# Patient Record
Sex: Male | Born: 2000
Health system: Southern US, Community
[De-identification: ages and names within clinical notes are randomized; demographics above are authoritative.]

---
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
## 2018-04-03 ENCOUNTER — Telehealth: Payer: Self-pay | Admitting: Family Medicine

## 2018-04-03 NOTE — Telephone Encounter (Signed)
Appointment has been made for 04/24/18 at 12:15pm.

## 2018-04-03 NOTE — Telephone Encounter (Signed)
Left vm for Craig Osborn to call back. Need to r/s new pt appt.

## 2018-04-03 NOTE — Telephone Encounter (Signed)
Dr. Dayton MartesAron please advise[place]    Copied from CRM [phone]. Topic: Appointment Scheduling - Scheduling Inquiry for Clinic >> Mar 30, 2018 12:14 PM [name], [name] wrote: Reason for CRM: pt has aged out of peds, and going to college this next year.  Was hoping to get a new pt appt prior to June 30 (when he leaves for summer school) but will return for one week on 8/7. Is it ok to work in? Mom is a pt of Dr Dayton MartesAron

## 2018-04-03 NOTE — Telephone Encounter (Signed)
Yes okay to work in.

## 2018-04-24 ENCOUNTER — Ambulatory Visit (INDEPENDENT_AMBULATORY_CARE_PROVIDER_SITE_OTHER): Payer: BLUE CROSS/BLUE SHIELD | Admitting: Family Medicine

## 2018-04-24 ENCOUNTER — Encounter: Payer: Self-pay | Admitting: Family Medicine

## 2018-04-24 VITALS — BP 110/66 | HR 71 | Temp 98.3°F | Ht 74.5 in | Wt 162.2 lb

## 2018-04-24 DIAGNOSIS — Z23 Encounter for immunization: Secondary | ICD-10-CM

## 2018-04-24 DIAGNOSIS — Z020 Encounter for examination for admission to educational institution: Secondary | ICD-10-CM

## 2018-04-24 NOTE — Patient Instructions (Signed)
Great to see you. Congratulations!  Come back to your second dose of your HPV vaccine when you come home.

## 2018-04-24 NOTE — Assessment & Plan Note (Signed)
Discussed dangers of smoking, alcohol, and drug abuse.  Also discussed sexual activity, pregnancy risk, and STD risk.  Encouraged to get regular exercise and a balanced diet.  Discussed immunizations and they have also been updated in the chart.  

## 2018-04-24 NOTE — Progress Notes (Signed)
Subjective:   Patient ID: Craig Blossomhristopher Malone Jr., male    DOB: [birth year], 17 y.o.   MRN: [id number]  Craig BlossomChristopher Rounds Jr. is a pleasant 17 y.o. year old male who presents to clinic today with New Patient (Initial Visit) (Patient is here today to establish care.  Patient had last wellness in November and [facility] and they are faxing that note over.  He would like to receive the Bexsero and HPV#1 today.  He does have a murmur per paperwork)  on 04/24/2018  HPI:  Patient is here today to establish care. Patient had last wellness in November [facility] and they are faxing that note over. He would like to receive the Bexsero and HPV#1 today.  Was born with a heart murmur, but per mom, out grew it by the time he was 1.  He is going to [employer] [place] in the fall.  Very excited.  Plans to major in math- plans to go into sports analytics/statistics.  Has never been sexually active.  Current Outpatient Medications on File Prior to Visit  Medication Sig Dispense Refill  . chlorhexidine (PERIDEX) 0.12 % solution Use as directed 15 mLs in the mouth or throat 2 (two) times daily. 150 mL 0   No current facility-administered medications on file prior to visit.     No Known Allergies  No past medical history on file.  No past surgical history on file.  No family history on file.  Social History   Socioeconomic History  . Marital status: Single    Spouse name: Not on file  . Number of children: Not on file  . Years of education: Not on file  . Highest education level: Not on file  Occupational History  . Not on file  Social Needs  . Financial resource strain: Not on file  . Food insecurity:    Worry: Not on file    Inability: Not on file  . Transportation needs:    Medical: Not on file    Non-medical: Not on file  Tobacco Use  . Smoking status: Never Smoker  . Smokeless tobacco: Never Used  Substance and Sexual Activity  . Alcohol use: Not on file    . Drug use: Not on file  . Sexual activity: Not on file  Lifestyle  . Physical activity:    Days per week: Not on file    Minutes per session: Not on file  . Stress: Not on file  Relationships  . Social connections:    Talks on phone: Not on file    Gets together: Not on file    Attends religious service: Not on file    Active member of club or organization: Not on file    Attends meetings of clubs or organizations: Not on file    Relationship status: Not on file  . Intimate partner violence:    Fear of current or ex partner: Not on file    Emotionally abused: Not on file    Physically abused: Not on file    Forced sexual activity: Not on file  Other Topics Concern  . Not on file  Social History Narrative  . Not on file   The PMH, PSH, Social History, Family History, Medications, and allergies have [facility], and have been updated if relevant.    Review of Systems  Constitutional: Negative.   HENT: Negative.   Eyes: Negative.   Respiratory: Negative.   Cardiovascular:  Negative.   Gastrointestinal: Negative.   Endocrine: Negative.   Genitourinary: Negative.   Musculoskeletal: Negative.   Skin: Negative.   Allergic/Immunologic: Negative.   Neurological: Negative.   Hematological: Negative.   Psychiatric/Behavioral: Negative.   All other systems reviewed and are negative.      Objective:    BP 110/66 (BP Location: Left Arm, Patient Position: Sitting, Cuff Size: Normal)   Pulse 71   Temp 98.3 F (36.8 C) (Oral)   Ht 6' 2.5" (1.892 m)   Wt 162 lb 3.2 oz (73.6 kg)   SpO2 99%   BMI 20.55 kg/m    Physical Exam  Constitutional: He appears well-developed and well-nourished. No distress.  HENT:  Head: Normocephalic and atraumatic.  Eyes: EOM are normal.  Neck: Normal range of motion.  Cardiovascular: Normal rate and regular rhythm.  Pulmonary/Chest: Effort normal and breath sounds normal.  Abdominal: Soft. Bowel sounds are normal.  Neurological:  He is alert.  Skin: Skin is warm and dry. He is not diaphoretic.  Psychiatric: He has a normal mood and affect. His behavior is normal. Judgment and thought content normal.  Nursing note and vitals reviewed.         Assessment & Plan:   Need for HPV vaccination - Plan: HPV 9-valent vaccine,Recombinat  Need for meningococcal vaccination - Plan: Meningococcal B, OMV No follow-ups on file.

## 2018-05-16 ENCOUNTER — Encounter: Payer: Self-pay | Admitting: Family Medicine

## 2018-06-07 ENCOUNTER — Ambulatory Visit (INDEPENDENT_AMBULATORY_CARE_PROVIDER_SITE_OTHER): Payer: BLUE CROSS/BLUE SHIELD | Admitting: Behavioral Health

## 2018-06-07 DIAGNOSIS — Z23 Encounter for immunization: Secondary | ICD-10-CM | POA: Diagnosis not present

## 2018-06-07 NOTE — Progress Notes (Signed)
Patient presents in clinic today for #2 HPV & Meningococcal vaccinations. IM injections were given in both the right & left deltoid. Patient tolerated the injections well. No signs or symptoms of a reaction before leaving the nurse visit. Patient will call the office at a later date to schedule #3 HPV vaccination.

## 2018-07-12 ENCOUNTER — Ambulatory Visit: Payer: Managed Care, Other (non HMO) | Admitting: Family Medicine

## 2020-01-07 ENCOUNTER — Other Ambulatory Visit: Payer: Self-pay

## 2020-01-07 ENCOUNTER — Ambulatory Visit (INDEPENDENT_AMBULATORY_CARE_PROVIDER_SITE_OTHER): Payer: BC Managed Care – PPO | Admitting: Nurse Practitioner

## 2020-01-07 ENCOUNTER — Encounter: Payer: Self-pay | Admitting: Nurse Practitioner

## 2020-01-07 VITALS — BP 106/72 | HR 72 | Temp 97.7°F | Ht 74.5 in | Wt 170.2 lb

## 2020-01-07 DIAGNOSIS — Z136 Encounter for screening for cardiovascular disorders: Secondary | ICD-10-CM | POA: Diagnosis not present

## 2020-01-07 DIAGNOSIS — Z13220 Encounter for screening for lipoid disorders: Secondary | ICD-10-CM

## 2020-01-07 DIAGNOSIS — Z0000 Encounter for general adult medical examination without abnormal findings: Secondary | ICD-10-CM

## 2020-01-07 LAB — CBC
HCT: 42.2 % (ref 39.0–52.0)
Hemoglobin: 14.3 g/dL (ref 13.0–17.0)
MCHC: 33.9 g/dL (ref 30.0–36.0)
MCV: 95.3 fl (ref 78.0–100.0)
Platelets: 308 10*3/uL (ref 150.0–400.0)
RBC: 4.43 Mil/uL (ref 4.22–5.81)
RDW: 12.1 % (ref 11.5–14.6)
WBC: 4.6 10*3/uL (ref 4.5–10.5)

## 2020-01-07 LAB — COMPREHENSIVE METABOLIC PANEL
ALT: 11 U/L (ref 0–53)
AST: 12 U/L (ref 0–37)
Albumin: 4.6 g/dL (ref 3.5–5.2)
Alkaline Phosphatase: 71 U/L (ref 39–117)
BUN: 11 mg/dL (ref 6–23)
CO2: 32 mEq/L (ref 19–32)
Calcium: 10.1 mg/dL (ref 8.4–10.5)
Chloride: 101 mEq/L (ref 96–112)
Creatinine, Ser: 1.23 mg/dL (ref 0.40–1.50)
GFR: 90.51 mL/min (ref >60.00)
Glucose, Bld: 87 mg/dL (ref 70–99)
Potassium: 4.3 mEq/L (ref 3.5–5.1)
Sodium: 138 mEq/L (ref 135–145)
Total Bilirubin: 1 mg/dL (ref 0.2–1.2)
Total Protein: 7.5 g/dL (ref 6.0–8.3)

## 2020-01-07 LAB — LIPID PANEL
Cholesterol: 205 mg/dL — ABNORMAL HIGH (ref 0–200)
HDL: 56.6 mg/dL (ref >39.00)
LDL Cholesterol: 135 mg/dL — ABNORMAL HIGH (ref 0–99)
NonHDL: 148.54
Total CHOL/HDL Ratio: 4
Triglycerides: 66 mg/dL (ref 0.0–149.0)
VLDL: 13.2 mg/dL (ref 0.0–40.0)

## 2020-01-07 LAB — TSH: TSH: 2.82 u[IU]/mL (ref 0.35–5.50)

## 2020-01-07 NOTE — Patient Instructions (Addendum)
Go to lab for blood draw.  Wish you the best with school.  You make use multivitamin (Men) 1tab daily with food (nature made or centrum brand)   Preventive Care 21-21 Years Old, Male Preventive care refers to lifestyle choices and visits with your health care provider that can promote health and wellness. At this stage in your life, you may start seeing a primary care physician instead of a pediatrician. Your health care is now your responsibility. Preventive care for young adults includes:  A yearly physical exam. This is also called an annual wellness visit.  Regular dental and eye exams.  Immunizations.  Screening for certain conditions.  Healthy lifestyle choices, such as diet and exercise. What can I expect for my preventive care visit? Physical exam Your health care provider may check:  Height and weight. These may be used to calculate body mass index (BMI), which is a measurement that tells if you are at a healthy weight.  Heart rate and blood pressure.  Body temperature. Counseling Your health care provider may ask you questions about:  Past medical problems and family medical history.  Alcohol, tobacco, and drug use.  Home and relationship well-being.  Access to firearms.  Emotional well-being.  Diet, exercise, and sleep habits.  Sexual activity and sexual health. What immunizations do I need?  Influenza (flu) vaccine  This is recommended every year. Tetanus, diphtheria, and pertussis (Tdap) vaccine  You may need a Td booster every 10 years. Varicella (chickenpox) vaccine  You may need this vaccine if you have not already been vaccinated. Human papillomavirus (HPV) vaccine  If recommended by your health care provider, you may need three doses over 6 months. Measles, mumps, and rubella (MMR) vaccine  You may need at least one dose of MMR. You may also need a second dose. Meningococcal conjugate (MenACWY) vaccine  One dose is recommended if you  are 21-67 years old and a Market researcher living in a residence hall, or if you have one of several medical conditions. You may also need additional booster doses. Pneumococcal conjugate (PCV13) vaccine  You may need this if you have certain conditions and were not previously vaccinated. Pneumococcal polysaccharide (PPSV23) vaccine  You may need one or two doses if you smoke cigarettes or if you have certain conditions. Hepatitis A vaccine  You may need this if you have certain conditions or if you travel or work in places where you may be exposed to hepatitis A. Hepatitis B vaccine  You may need this if you have certain conditions or if you travel or work in places where you may be exposed to hepatitis B. Haemophilus influenzae type b (Hib) vaccine  You may need this if you have certain risk factors. You may receive vaccines as individual doses or as more than one vaccine together in one shot (combination vaccines). Talk with your health care provider about the risks and benefits of combination vaccines. What tests do I need? Blood tests  Lipid and cholesterol levels. These may be checked every 5 years starting at age 21.  Hepatitis C test.  Hepatitis B test. Screening  Genital exam to check for testicular cancer or hernias.  Sexually transmitted disease (STD) testing, if you are at risk. Other tests  Tuberculosis skin test.  Vision and hearing tests.  Skin exam. Follow these instructions at home: Eating and drinking   Eat a diet that includes fresh fruits and vegetables, whole grains, lean protein, and low-fat dairy products.  Drink enough  fluid to keep your urine pale yellow.  Do not drink alcohol if: ? Your health care provider tells you not to drink. ? You are under the legal drinking age. In the [place] the legal drinking age is 21.  If you drink alcohol: ? Limit how much you have to 0-2 drinks a day. ? Be aware of how much alcohol is in your drink.  In the [place] one drink equals one 12 oz bottle of beer (355 mL), one 5 oz glass of wine (148 mL), or one 1 oz glass of hard liquor (44 mL). Lifestyle  Take daily care of your teeth and gums.  Stay active. Exercise at least 30 minutes 5 or more days of the week.  Do not use any products that contain nicotine or tobacco, such as cigarettes, e-cigarettes, and chewing tobacco. If you need help quitting, ask your health care provider.  Do not use drugs.  If you are sexually active, practice safe sex. Use a condom or other form of protection to prevent STIs (sexually transmitted infections).  Find healthy ways to cope with stress, such as: ? Meditation, yoga, or listening to music. ? Journaling. ? Talking to a trusted person. ? Spending time with friends and family. Safety  Always wear your seat belt while driving or riding in a vehicle.  Do not drive if you have been drinking alcohol.  Do not ride with someone who has been drinking.  Do not drive when you are tired or distracted.  Do not text while driving.  Wear a helmet and other protective equipment during sports activities.  If you have firearms in your house, make sure you follow all gun safety procedures.  Seek help if you have been bullied, physically abused, or sexually abused.  Use the Internet responsibly to avoid dangers such as online bullying and online sex predators. What's next?  Go to your health care provider once a year for a well check visit.  Ask your health care provider how often you should have your eyes and teeth checked.  Stay up to date on all vaccines. This information is not intended to replace advice given to you by your health care provider. Make sure you discuss any questions you have with your health care provider. Document Revised: 10/11/2018 Document Reviewed: 10/11/2018 Elsevier Patient Education  2020 [employer].

## 2020-01-07 NOTE — Progress Notes (Signed)
Subjective:    Patient ID: Craig Osborn., male    DOB: Apr 02, 2001, 19 y.o.   MRN: [id number]  Patient presents today for complete physical  HPI  denies any acute complaints.  Sexual History (orientation,birth control, marital status, STD):not sexually active at this time, denies need for STD screen  Depression/Suicide: Depression screen [facility] 2/9 01/07/2020 11/30/2017  Decreased Interest 0 0  Down, Depressed, Hopeless 0 0  PHQ - 2 Score 0 0   Vision:up to date  Dental:up to date  Immunizations: (TDAP, Hep C screen, Pneumovax, Influenza, zoster)  Health Maintenance  Topic Date Due  . Flu Shot  01/29/2020*  . HIV Screening  01/06/2021*  . Tetanus Vaccine  04/18/2021  *Topic was postponed. The date shown is not the original due date.   Diet:regular.  Weight:  Wt Readings from Last 3 Encounters:  01/07/20 170 lb 3.2 oz (77.2 kg)  04/24/18 162 lb 3.2 oz (73.6 kg) (67 %, Z= 0.44)*  11/30/17 164 lb (74.4 kg) (71 %, Z= 0.56)*   * Growth percentiles are based on CDC (Boys, 2-20 Years) data.   Exercise:none  Fall Risk: Fall Risk  01/07/2020 11/30/2017  Falls in the past year? 0 No  Number falls in past yr: 0 -  Injury with Fall? 0 -   Medications and allergies reviewed with patient and updated if appropriate.  Patient Active Problem List   Diagnosis Date Noted  . Encounter for school examination 04/24/2018  . ANEMIA, OTHER, UNSPECIFIED 12/28/2006    Current Outpatient Medications on File Prior to Visit  Medication Sig Dispense Refill  . chlorhexidine (PERIDEX) 0.12 % solution Use as directed 15 mLs in the mouth or throat 2 (two) times daily. (Patient not taking: Reported on 01/07/2020) 150 mL 0   No current facility-administered medications on file prior to visit.    History reviewed. No pertinent past medical history.  History reviewed. No pertinent surgical history.  Social History   Socioeconomic History  . Marital status: Single    Spouse name: Not  on file  . Number of children: Not on file  . Years of education: Not on file  . Highest education level: Not on file  Occupational History  . Not on file  Tobacco Use  . Smoking status: Never Smoker  . Smokeless tobacco: Never Used  Substance and Sexual Activity  . Alcohol use: Never    Alcohol/week: 0.0 standard drinks  . Drug use: Never  . Sexual activity: Not on file  Other Topics Concern  . Not on file  Social History Narrative  . Not on file   Social Determinants of Health   Financial Resource Strain:   . Difficulty of Paying Living Expenses: Not on file  Food Insecurity:   . Worried About Programme researcher, broadcasting/film/video in the Last Year: Not on file  . Ran Out of Food in the Last Year: Not on file  Transportation Needs:   . Lack of Transportation (Medical): Not on file  . Lack of Transportation (Non-Medical): Not on file  Physical Activity:   . Days of Exercise per Week: Not on file  . Minutes of Exercise per Session: Not on file  Stress:   . Feeling of Stress : Not on file  Social Connections:   . Frequency of Communication with Friends and Family: Not on file  . Frequency of Social Gatherings with Friends and Family: Not on file  . Attends Religious Services: Not on file  . Active  Member of Clubs or Organizations: Not on file  . Attends Archivist Meetings: Not on file  . Marital Status: Not on file    Family History  Problem Relation Age of Onset  . Hypertension Mother   . Gestational diabetes Mother   . Hyperlipidemia Father   . Asthma Brother   . Asthma Maternal Grandmother   . Diabetes Maternal Grandmother   . Hypertension Maternal Grandmother   . Hypertension Paternal Grandmother   . Lung cancer Paternal Grandfather        Smoker        Review of Systems  Constitutional: Negative for fever, malaise/fatigue and weight loss.  HENT: Negative for congestion and sore throat.   Eyes:       Negative for visual changes  Respiratory: Negative for  cough and shortness of breath.   Cardiovascular: Negative for chest pain, palpitations and leg swelling.  Gastrointestinal: Negative for blood in stool, constipation, diarrhea and heartburn.  Genitourinary: Negative for dysuria, frequency and urgency.  Musculoskeletal: Negative for falls, joint pain and myalgias.  Skin: Negative for rash.  Neurological: Negative for dizziness, sensory change and headaches.  Endo/Heme/Allergies: Does not bruise/bleed easily.  Psychiatric/Behavioral: Negative for depression, substance abuse and suicidal ideas. The patient is not nervous/anxious and does not have insomnia.     Objective:   Vitals:   01/07/20 0820  BP: 106/72  Pulse: 72  Temp: 97.7 F (36.5 C)  SpO2: 97%    Body mass index is 21.56 kg/m.   Physical Examination:  Physical Exam Vitals reviewed.  Constitutional:      General: He is not in acute distress.    Appearance: He is well-developed.  HENT:     Right Ear: Tympanic membrane, ear canal and external ear normal.     Left Ear: Tympanic membrane, ear canal and external ear normal.  Eyes:     Extraocular Movements: Extraocular movements intact.     Conjunctiva/sclera: Conjunctivae normal.  Cardiovascular:     Rate and Rhythm: Normal rate and regular rhythm.     Heart sounds: Normal heart sounds.  Pulmonary:     Effort: Pulmonary effort is normal. No respiratory distress.     Breath sounds: Normal breath sounds.  Chest:     Chest wall: No tenderness.  Abdominal:     General: Bowel sounds are normal.     Palpations: Abdomen is soft.  Musculoskeletal:        General: Normal range of motion.     Cervical back: Normal range of motion and neck supple.     Right lower leg: No edema.     Left lower leg: No edema.  Lymphadenopathy:     Cervical: No cervical adenopathy.  Skin:    General: Skin is warm and dry.     Findings: No rash.  Neurological:     Mental Status: He is alert and oriented to person, place, and time.      Deep Tendon Reflexes: Reflexes are normal and symmetric.  Psychiatric:        Mood and Affect: Mood normal.        Behavior: Behavior normal.        Thought Content: Thought content normal.     ASSESSMENT and PLAN: This visit occurred during the SARS-CoV-2 public health emergency.  Safety protocols were in place, including screening questions prior to the visit, additional usage of staff PPE, and extensive cleaning of exam room while observing appropriate contact time as indicated  for disinfecting solutions.   Quavis was seen today for establish care.  Diagnoses and all orders for this visit:  Preventative health care -     CBC -     Comprehensive metabolic panel -     TSH -     Lipid panel  Encounter for lipid screening for cardiovascular disease -     Lipid panel    No problem-specific Assessment & Plan notes found for this encounter.      Problem List Items Addressed This Visit    None    Visit Diagnoses    Preventative health care    -  Primary   Relevant Orders   CBC   Comprehensive metabolic panel   TSH   Lipid panel   Encounter for lipid screening for cardiovascular disease       Relevant Orders   Lipid panel       Follow up: Return in about 1 year (around 01/06/2021) for CPE (fasting).  Alysia Penna, NP

## 2020-01-13 ENCOUNTER — Ambulatory Visit: Payer: BC Managed Care – PPO | Attending: Internal Medicine

## 2020-01-13 ENCOUNTER — Encounter: Payer: Self-pay | Admitting: Nurse Practitioner

## 2020-01-13 DIAGNOSIS — Z20822 Contact with and (suspected) exposure to covid-19: Secondary | ICD-10-CM

## 2020-01-14 LAB — NOVEL CORONAVIRUS, NAA: SARS-CoV-2, NAA: NOT DETECTED

## 2020-03-12 ENCOUNTER — Ambulatory Visit: Payer: BC Managed Care – PPO | Attending: Internal Medicine

## 2020-03-12 DIAGNOSIS — Z23 Encounter for immunization: Secondary | ICD-10-CM

## 2020-03-12 NOTE — Progress Notes (Signed)
   [id number]  Name:  Craig Osborn.    MRN: [id number] DOB: 11/01/2000  03/12/2020  Mr. [name] was observed post Covid-19 immunization for 15 minutes without incident. He was provided with Vaccine Information Sheet and instruction to access the V-Safe system.   Mr. [name] was instructed to call 911 with any severe reactions post vaccine: Marland Kitchen Difficulty breathing  . Swelling of face and throat  . A fast heartbeat  . A bad rash all over body  . Dizziness and weakness   Immunizations Administered    Name Date Dose VIS Date Route   Pfizer COVID-19 Vaccine 03/12/2020  1:08 PM 0.3 mL 12/25/2018 Intramuscular   Manufacturer: [employer], [employer]   Lot: [id number]   NDC: [address]-2

## 2020-04-06 ENCOUNTER — Ambulatory Visit: Payer: BC Managed Care – PPO

## 2021-03-08 ENCOUNTER — Other Ambulatory Visit: Payer: Self-pay

## 2021-03-08 ENCOUNTER — Ambulatory Visit (INDEPENDENT_AMBULATORY_CARE_PROVIDER_SITE_OTHER): Payer: BC Managed Care – PPO | Admitting: Nurse Practitioner

## 2021-03-08 ENCOUNTER — Encounter: Payer: Self-pay | Admitting: Nurse Practitioner

## 2021-03-08 VITALS — BP 100/70 | HR 70 | Temp 98.8°F | Ht 75.5 in | Wt 174.0 lb

## 2021-03-08 DIAGNOSIS — Z13220 Encounter for screening for lipoid disorders: Secondary | ICD-10-CM

## 2021-03-08 DIAGNOSIS — Z0000 Encounter for general adult medical examination without abnormal findings: Secondary | ICD-10-CM | POA: Diagnosis not present

## 2021-03-08 DIAGNOSIS — Z23 Encounter for immunization: Secondary | ICD-10-CM | POA: Diagnosis not present

## 2021-03-08 DIAGNOSIS — Z136 Encounter for screening for cardiovascular disorders: Secondary | ICD-10-CM | POA: Diagnosis not present

## 2021-03-08 NOTE — Progress Notes (Signed)
Subjective:    Patient ID: Craig Osborn., male    DOB: November 06, 2000, 20 y.o.   MRN: [id number]  Patient presents today for CPE  HPI Denies any acute compliant.  Sexual History (orientation,birth control, marital status, STD): sexually active, denies need for STD screen, no GU symptoms  Depression/Suicide: Depression screen [facility] 2/9 03/08/2021 01/07/2020 11/30/2017  Decreased Interest 0 0 0  Down, Depressed, Hopeless 0 0 0  PHQ - 2 Score 0 0 0   Vision:up to date  Dental:up to date  Immunizations: (TDAP, Hep C screen, Pneumovax, Influenza, zoster)  Health Maintenance  Topic Date Due  . Hepatitis C Screening: USPSTF Recommendation to screen - Ages 18-79 yo.  03/08/2022*  . HIV Screening  03/08/2022*  . Tetanus Vaccine  04/18/2021  . Flu Shot  05/31/2021  . HPV Vaccine  Completed  . COVID-19 Vaccine  Completed  *Topic was postponed. The date shown is not the original due date.   Diet:regular Exercise: daily Weight:  Wt Readings from Last 3 Encounters:  03/08/21 174 lb (78.9 kg)  01/07/20 170 lb 3.2 oz (77.2 kg)  04/24/18 162 lb 3.2 oz (73.6 kg) (67 %, Z= 0.44)*   * Growth percentiles are based on CDC (Boys, 2-20 Years) data.   Fall Risk: Fall Risk  01/07/2020 11/30/2017  Falls in the past year? 0 No  Number falls in past yr: 0 -  Injury with Fall? 0 -   Medications and allergies reviewed with patient and updated if appropriate.  Patient Active Problem List   Diagnosis Date Noted  . Encounter for school examination 04/24/2018  . ANEMIA, OTHER, UNSPECIFIED 12/28/2006   No current outpatient medications on file prior to visit.   No current facility-administered medications on file prior to visit.   History reviewed. No pertinent past medical history.  History reviewed. No pertinent surgical history.  Social History   Socioeconomic History  . Marital status: Single    Spouse name: Not on file  . Number of children: Not on file  . Years of education: Not  on file  . Highest education level: Not on file  Occupational History  . Not on file  Tobacco Use  . Smoking status: Never Smoker  . Smokeless tobacco: Never Used  Substance and Sexual Activity  . Alcohol use: Never    Alcohol/week: 0.0 standard drinks  . Drug use: Never  . Sexual activity: Not on file  Other Topics Concern  . Not on file  Social History Narrative  . Not on file   Social Determinants of Health   Financial Resource Strain: Not on file  Food Insecurity: Not on file  Transportation Needs: Not on file  Physical Activity: Not on file  Stress: Not on file  Social Connections: Not on file   Family History  Problem Relation Age of Onset  . Hypertension Mother   . Gestational diabetes Mother   . Hyperlipidemia Father   . Asthma Brother   . Asthma Maternal Grandmother   . Diabetes Maternal Grandmother   . Hypertension Maternal Grandmother   . Hypertension Paternal Grandmother   . Lung cancer Paternal Grandfather        Smoker        Review of Systems  Constitutional: Negative for fever, malaise/fatigue and weight loss.  HENT: Negative for congestion and sore throat.   Eyes:       Negative for visual changes  Respiratory: Negative for cough and shortness of breath.   Cardiovascular:  Negative for chest pain, palpitations and leg swelling.  Gastrointestinal: Negative for blood in stool, constipation, diarrhea and heartburn.  Genitourinary: Negative for dysuria, frequency and urgency.  Musculoskeletal: Negative for falls, joint pain and myalgias.  Skin: Negative for rash.  Neurological: Negative for dizziness, sensory change and headaches.  Endo/Heme/Allergies: Does not bruise/bleed easily.  Psychiatric/Behavioral: Negative for depression, substance abuse and suicidal ideas. The patient is not nervous/anxious.    Objective:   Vitals:   03/08/21 1349  BP: 100/70  Pulse: 70  Temp: 98.8 F (37.1 C)  SpO2: 98%   Body mass index is 21.46  kg/m.  Physical Examination:  Physical Exam Vitals reviewed.  Constitutional:      General: He is not in acute distress.    Appearance: He is well-developed.  HENT:     Right Ear: Tympanic membrane, ear canal and external ear normal.     Left Ear: Tympanic membrane, ear canal and external ear normal.  Eyes:     Extraocular Movements: Extraocular movements intact.     Conjunctiva/sclera: Conjunctivae normal.  Cardiovascular:     Rate and Rhythm: Normal rate and regular rhythm.     Pulses: Normal pulses.     Heart sounds: Normal heart sounds.  Pulmonary:     Effort: Pulmonary effort is normal. No respiratory distress.     Breath sounds: Normal breath sounds.  Chest:     Chest wall: No tenderness.  Abdominal:     General: Bowel sounds are normal.     Palpations: Abdomen is soft.  Musculoskeletal:        General: Normal range of motion.     Cervical back: Normal range of motion and neck supple.     Right lower leg: No edema.     Left lower leg: No edema.  Lymphadenopathy:     Cervical: No cervical adenopathy.  Skin:    General: Skin is warm and dry.  Neurological:     Mental Status: He is alert and oriented to person, place, and time.     Deep Tendon Reflexes: Reflexes are normal and symmetric.  Psychiatric:        Mood and Affect: Mood normal.        Behavior: Behavior normal.        Thought Content: Thought content normal.    ASSESSMENT and PLAN: This visit occurred during the SARS-CoV-2 public health emergency.  Safety protocols were in place, including screening questions prior to the visit, additional usage of staff PPE, and extensive cleaning of exam room while observing appropriate contact time as indicated for disinfecting solutions.   Craig Osborn was seen today for annual exam.  Diagnoses and all orders for this visit:  Preventative health care -     CBC -     Comprehensive metabolic panel -     Lipid panel -     HPV 9-valent  vaccine,Recombinat  Encounter for lipid screening for cardiovascular disease -     Lipid panel  Need for HPV vaccine -     HPV 9-valent vaccine,Recombinat      Problem List Items Addressed This Visit   None   Visit Diagnoses    Preventative health care    -  Primary   Relevant Orders   CBC   Comprehensive metabolic panel   Lipid panel   HPV 9-valent vaccine,Recombinat (Completed)   Encounter for lipid screening for cardiovascular disease       Relevant Orders   Lipid panel  Need for HPV vaccine       Relevant Orders   HPV 9-valent vaccine,Recombinat (Completed)      Follow up: Return in about 1 year (around 03/08/2022) for CPE (fasting).  Alysia Penna, NP

## 2021-03-08 NOTE — Patient Instructions (Addendum)
Go to lab for blood draw Craig Osborn with school  Preventive Care 44-22 Years Old, Male Preventive care refers to lifestyle choices and visits with your health care provider that can promote health and wellness. At this stage in your life, you may start seeing a primary care physician instead of a pediatrician. It is important to take responsibility for your health and well-being. Preventive care for young adults includes:  A yearly physical exam. This is also called an annual wellness visit.  Regular dental and eye exams.  Immunizations.  Screening for certain conditions.  Healthy lifestyle choices, such as: ? Eating a healthy diet. ? Getting regular exercise. ? Not using drugs or products that contain nicotine and tobacco. ? Limiting alcohol use. What can I expect for my preventive care visit? Physical exam Your health care provider may check your:  Height and weight. These may be used to calculate your BMI (body mass index). BMI is a measurement that tells if you are at a healthy weight.  Heart rate and blood pressure.  Body temperature.  Skin for abnormal spots. Counseling Your health care provider may ask you questions about your:  Past medical problems.  Family's medical history.  Alcohol, tobacco, and drug use.  Home life and relationship well-being.  Access to firearms.  Emotional well-being.  Diet, exercise, and sleep habits.  Sexual activity and sexual health. What immunizations do I need? Vaccines are usually given at various ages, according to a schedule. Your health care provider will recommend vaccines for you based on your age, medical history, and lifestyle or other factors, such as travel or where you work.   What tests do I need? Blood tests  Lipid and cholesterol levels. These may be checked every 5 years starting at age 22.  Hepatitis C test.  Hepatitis B test. Screening  Genital exam to check for testicular cancer or hernias.  STD  (sexually transmitted disease) testing, if you are at risk. Other tests  Tuberculosis skin test.  Vision and hearing tests.  Skin exam. Talk with your health care provider about your test results, treatment options, and if necessary, the need for more tests. Follow these instructions at home: Eating and drinking  Eat a healthy diet that includes fresh fruits and vegetables, whole grains, lean protein, and low-fat dairy products.  Drink enough fluid to keep your urine pale yellow.  Do not drink alcohol if: ? Your health care provider tells you not to drink. ? You are under the legal drinking age. In the [place] the legal drinking age is 22.  If you drink alcohol: ? Limit how much you use to 0-2 drinks a day. ? Be aware of how much alcohol is in your drink. In the [place] one drink equals one 12 oz bottle of beer (355 mL), one 5 oz glass of wine (148 mL), or one 1 oz glass of hard liquor (44 mL).   Lifestyle  Take daily care of your teeth and gums. Brush your teeth every morning and night with fluoride toothpaste. Floss one time each day.  Stay active. Exercise for at least 30 minutes 5 or more days of the week.  Do not use any products that contain nicotine or tobacco, such as cigarettes, e-cigarettes, and chewing tobacco. If you need help quitting, ask your health care provider.  Do not use drugs.  If you are sexually active, practice safe sex. Use a condom or other form of protection to prevent STIs (sexually transmitted infections).  Find  healthy ways to cope with stress, such as: ? Meditation, yoga, or listening to music. ? Journaling. ? Talking to a trusted person. ? Spending time with friends and family. Safety  Always wear your seat belt while driving or riding in a vehicle.  Do not drive: ? If you have been drinking alcohol. Do not ride with someone who has been drinking. ? When you are tired or distracted. ? While texting.  Wear a helmet and other protective  equipment during sports activities.  If you have firearms in your house, make sure you follow all gun safety procedures.  Seek help if you have been bullied, physically abused, or sexually abused.  Use the Internet responsibly to avoid dangers, such as online bullying and online sex predators. What's next?  Go to your health care provider once a year for an annual wellness visit.  Ask your health care provider how often you should have your eyes and teeth checked.  Stay up to date on all vaccines. This information is not intended to replace advice given to you by your health care provider. Make sure you discuss any questions you have with your health care provider. Document Revised: 07/03/2019 Document Reviewed: 10/11/2018 Elsevier Patient Education  22 [employer].

## 2021-03-09 LAB — CBC
HCT: 41.2 % (ref 39.0–52.0)
Hemoglobin: 13.9 g/dL (ref 13.0–17.0)
MCHC: 33.8 g/dL (ref 30.0–36.0)
MCV: 94.8 fl (ref 78.0–100.0)
Platelets: 282 10*3/uL (ref 150.0–400.0)
RBC: 4.35 Mil/uL (ref 4.22–5.81)
RDW: 12.7 % (ref 11.5–15.5)
WBC: 2.9 10*3/uL — ABNORMAL LOW (ref 4.0–10.5)

## 2021-03-09 LAB — LIPID PANEL
Cholesterol: 204 mg/dL — ABNORMAL HIGH (ref 0–200)
HDL: 55.9 mg/dL (ref >39.00)
LDL Cholesterol: 133 mg/dL — ABNORMAL HIGH (ref 0–99)
NonHDL: 148.06
Total CHOL/HDL Ratio: 4
Triglycerides: 76 mg/dL (ref 0.0–149.0)
VLDL: 15.2 mg/dL (ref 0.0–40.0)

## 2021-03-09 LAB — COMPREHENSIVE METABOLIC PANEL
ALT: 19 U/L (ref 0–53)
AST: 15 U/L (ref 0–37)
Albumin: 4.8 g/dL (ref 3.5–5.2)
Alkaline Phosphatase: 58 U/L (ref 39–117)
BUN: 10 mg/dL (ref 6–23)
CO2: 32 mEq/L (ref 19–32)
Calcium: 10.1 mg/dL (ref 8.4–10.5)
Chloride: 100 mEq/L (ref 96–112)
Creatinine, Ser: 1.1 mg/dL (ref 0.40–1.50)
GFR: 95.99 mL/min (ref >60.00)
Glucose, Bld: 89 mg/dL (ref 70–99)
Potassium: 4.2 mEq/L (ref 3.5–5.1)
Sodium: 137 mEq/L (ref 135–145)
Total Bilirubin: 1.1 mg/dL (ref 0.2–1.2)
Total Protein: 7.5 g/dL (ref 6.0–8.3)

## 2022-06-20 ENCOUNTER — Ambulatory Visit (INDEPENDENT_AMBULATORY_CARE_PROVIDER_SITE_OTHER): Payer: Commercial Managed Care - PPO | Admitting: Nurse Practitioner

## 2022-06-20 ENCOUNTER — Encounter: Payer: Self-pay | Admitting: Nurse Practitioner

## 2022-06-20 VITALS — BP 112/80 | HR 79 | Temp 97.7°F | Ht 75.5 in | Wt 185.6 lb

## 2022-06-20 DIAGNOSIS — D649 Anemia, unspecified: Secondary | ICD-10-CM | POA: Diagnosis not present

## 2022-06-20 DIAGNOSIS — Z23 Encounter for immunization: Secondary | ICD-10-CM | POA: Diagnosis not present

## 2022-06-20 DIAGNOSIS — Z0001 Encounter for general adult medical examination with abnormal findings: Secondary | ICD-10-CM | POA: Diagnosis not present

## 2022-06-20 DIAGNOSIS — E7800 Pure hypercholesterolemia, unspecified: Secondary | ICD-10-CM

## 2022-06-20 LAB — COMPREHENSIVE METABOLIC PANEL
ALT: 15 U/L (ref 0–53)
AST: 14 U/L (ref 0–37)
Albumin: 4.4 g/dL (ref 3.5–5.2)
Alkaline Phosphatase: 50 U/L (ref 39–117)
BUN: 13 mg/dL (ref 6–23)
CO2: 30 mEq/L (ref 19–32)
Calcium: 9.5 mg/dL (ref 8.4–10.5)
Chloride: 103 mEq/L (ref 96–112)
Creatinine, Ser: 1.41 mg/dL (ref 0.40–1.50)
GFR: 70.62 mL/min (ref >60.00)
Glucose, Bld: 86 mg/dL (ref 70–99)
Potassium: 3.9 mEq/L (ref 3.5–5.1)
Sodium: 140 mEq/L (ref 135–145)
Total Bilirubin: 0.8 mg/dL (ref 0.2–1.2)
Total Protein: 7.2 g/dL (ref 6.0–8.3)

## 2022-06-20 LAB — LIPID PANEL
Cholesterol: 224 mg/dL — ABNORMAL HIGH (ref 0–200)
HDL: 61.4 mg/dL (ref >39.00)
LDL Cholesterol: 152 mg/dL — ABNORMAL HIGH (ref 0–99)
NonHDL: 162.84
Total CHOL/HDL Ratio: 4
Triglycerides: 54 mg/dL (ref 0.0–149.0)
VLDL: 10.8 mg/dL (ref 0.0–40.0)

## 2022-06-20 LAB — CBC
HCT: 37.9 % — ABNORMAL LOW (ref 39.0–52.0)
Hemoglobin: 12.4 g/dL — ABNORMAL LOW (ref 13.0–17.0)
MCHC: 32.7 g/dL (ref 30.0–36.0)
MCV: 97.7 fl (ref 78.0–100.0)
Platelets: 243 10*3/uL (ref 150.0–400.0)
RBC: 3.88 Mil/uL — ABNORMAL LOW (ref 4.22–5.81)
RDW: 12.9 % (ref 11.5–15.5)
WBC: 2.9 10*3/uL — ABNORMAL LOW (ref 4.0–10.5)

## 2022-06-20 NOTE — Progress Notes (Signed)
Complete physical exam  Patient: Craig Osborn.   DOB: April 06, 2001   20 y.o. Male  MRN: [id number] Visit Date: 06/20/2022  Subjective:    Chief Complaint  Patient presents with   Annual Exam    CPE Pt fasting C/o knee pain in both knees, would like an x-ray  Tdap given today    Craig Osborn. is a 21 y.o. male who presents today for a complete physical exam. He reports consuming a general diet. Gym/ health club routine includes basketball, cardio, and light weights. He generally feels well. He reports sleeping well. He does have additional problems to discuss today.  Vision:Yes Dental:No STD Screen:No  Most recent fall risk assessment:    01/07/2020    8:19 AM  Fall Risk   Falls in the past year? 0  Number falls in past yr: 0  Injury with Fall? 0   Most recent depression screenings:    06/20/2022   10:29 AM 03/08/2021    3:21 PM  PHQ 2/9 Scores  PHQ - 2 Score 0 0  PHQ- 9 Score 2    HPI  He reports hx of bilateral knee pain. Onset after increase activity (playing basketball and track). He denies any injury. Pain improved after strengthening exercises. He denies any swelling or effusion or redness or instability.  History reviewed. No pertinent past medical history. History reviewed. No pertinent surgical history. Social History   Socioeconomic History   Marital status: Single    Spouse name: Not on file   Number of children: Not on file   Years of education: Not on file   Highest education level: Not on file  Occupational History   Not on file  Tobacco Use   Smoking status: Never   Smokeless tobacco: Never  Substance and Sexual Activity   Alcohol use: Never    Alcohol/week: 0.0 standard drinks of alcohol   Drug use: Yes    Frequency: 2.0 times per week    Types: Marijuana    Comment: edible   Sexual activity: Not on file  Other Topics Concern   Not on file  Social History Narrative   Not on file   Social Determinants of Health    Financial Resource Strain: Not on file  Food Insecurity: Not on file  Transportation Needs: Not on file  Physical Activity: Not on file  Stress: Not on file  Social Connections: Not on file  Intimate Partner Violence: Not on file   Family Status  Relation Name Status   Mother  Alive   Father  Alive   Sister Casmalia Alive   Brother Hudson Alive   Sister Corshe' Alive   MGM  (Not Specified)   PGM  (Not Specified)   PGF  (Not Specified)   Family History  Problem Relation Age of Onset   Hypertension Mother    Gestational diabetes Mother    Hyperlipidemia Father    Asthma Brother    Asthma Maternal Grandmother    Diabetes Maternal Grandmother    Hypertension Maternal Grandmother    Hypertension Paternal Grandmother    Lung cancer Paternal Grandfather        Smoker   No Known Allergies  Patient Care Team: Cyleigh Massaro, Charlene Brooke, NP as PCP - General (Internal Medicine)   Medications: No outpatient medications prior to visit.   No facility-administered medications prior to visit.    Review of Systems  Constitutional:  Negative for fever.  HENT:  Negative for congestion and sore  throat.   Eyes:        Negative for visual changes  Respiratory:  Negative for cough and shortness of breath.   Cardiovascular:  Negative for chest pain, palpitations and leg swelling.  Gastrointestinal:  Negative for blood in stool, constipation and diarrhea.  Genitourinary:  Negative for dysuria, frequency and urgency.  Musculoskeletal:  Negative for arthralgias, joint swelling and myalgias.  Skin:  Negative for rash.  Neurological:  Negative for dizziness and headaches.  Hematological:  Does not bruise/bleed easily.  Psychiatric/Behavioral:  Negative for suicidal ideas. The patient is not nervous/anxious.    Last lipids Lab Results  Component Value Date   CHOL 204 (H) 03/08/2021   HDL 55.90 03/08/2021   LDLCALC 133 (H) 03/08/2021   TRIG 76.0 03/08/2021   CHOLHDL 4 03/08/2021         Objective:  BP 112/80 (BP Location: Right Arm, Patient Position: Sitting, Cuff Size: Normal)   Pulse 79   Temp 97.7 F (36.5 C) (Temporal)   Ht 6' 3.5" (1.918 m)   Wt 185 lb 9.6 oz (84.2 kg)   SpO2 98%   BMI 22.89 kg/m     BP Readings from Last 3 Encounters:  06/20/22 112/80  03/08/21 100/70  01/07/20 106/72   Wt Readings from Last 3 Encounters:  06/20/22 185 lb 9.6 oz (84.2 kg)  03/08/21 174 lb (78.9 kg)  01/07/20 170 lb 3.2 oz (77.2 kg)   Physical Exam Vitals and nursing note reviewed.  Constitutional:      General: He is not in acute distress.    Appearance: He is well-developed.  HENT:     Right Ear: Tympanic membrane, ear canal and external ear normal.     Left Ear: Tympanic membrane, ear canal and external ear normal.     Nose: Nose normal.     Mouth/Throat:     Pharynx: No oropharyngeal exudate.  Eyes:     Extraocular Movements: Extraocular movements intact.     Conjunctiva/sclera: Conjunctivae normal.     Pupils: Pupils are equal, round, and reactive to light.  Cardiovascular:     Rate and Rhythm: Normal rate and regular rhythm.     Heart sounds: Normal heart sounds.  Pulmonary:     Effort: Pulmonary effort is normal. No respiratory distress.     Breath sounds: Normal breath sounds.  Chest:     Chest wall: No tenderness.  Abdominal:     General: Bowel sounds are normal.     Palpations: Abdomen is soft.  Musculoskeletal:        General: No swelling, tenderness, deformity or signs of injury. Normal range of motion.     Right shoulder: Normal.     Left shoulder: Normal.     Right upper arm: Normal.     Left upper arm: Normal.     Right wrist: Normal.     Left wrist: Normal.     Right hand: Normal.     Left hand: Normal.     Cervical back: Normal, normal range of motion and neck supple.     Lumbar back: Normal.     Right hip: Normal.     Left hip: Normal.     Right upper leg: Normal.     Left upper leg: Normal.     Right knee: Normal.     Left  knee: Normal.     Right lower leg: Normal.     Left lower leg: Normal. No edema.  Lymphadenopathy:  Cervical: No cervical adenopathy.  Skin:    General: Skin is warm and dry.     Findings: No rash.  Neurological:     Mental Status: He is alert and oriented to person, place, and time.     Deep Tendon Reflexes: Reflexes are normal and symmetric.  Psychiatric:        Mood and Affect: Mood normal.        Behavior: Behavior normal.        Thought Content: Thought content normal.     No results found for any visits on 06/20/22.      Routine Health Maintenance and Physical Exam  Immunization History  Administered Date(s) Administered   DTaP 11/29/1999, 01/31/2000, 03/29/2000, 01/17/2001, 04/15/2004   HPV 9-valent 04/24/2018, 06/07/2018, 03/08/2021   Hepatitis A 06/27/2008, 05/08/2012   Hepatitis B 12/31/98, 10/29/1999, 07/06/2000   HiB (PRP-OMP) 11/29/1999, 01/31/2000, 03/29/2000, 01/17/2001   IPV 11/29/1999, 01/31/2000, 03/29/2000, 10/05/2000, 04/15/2004   MMR 10/05/2000, 04/15/2004   Meningococcal B, OMV 04/24/2018, 06/07/2018   Meningococcal Conjugate 06/04/2013, 03/30/2018   PFIZER(Purple Top)SARS-COV-2 Vaccination 03/12/2020, 04/10/2020, 12/17/2020   Pneumococcal Conjugate-13 11/29/1999, 01/31/2000, 03/29/2000, 01/17/2001, 03/29/2001   Tdap 04/19/2011, 06/20/2022   Varicella 01/17/2001, 06/27/2008    Health Maintenance  Topic Date Due   HIV Screening  Never done   Hepatitis C Screening  Never done   INFLUENZA VACCINE  05/31/2022   COVID-19 Vaccine (4 - Pfizer series) 07/06/2022 (Originally 02/11/2021)   TETANUS/TDAP  06/20/2032   HPV VACCINES  Completed    Discussed health benefits of physical activity, and encouraged him to engage in regular exercise appropriate for his age and condition.  Problem List Items Addressed This Visit   None Visit Diagnoses     Encounter for preventative adult health care exam with abnormal findings    -  Primary   Relevant Orders    CBC   Comprehensive metabolic panel   Elevated LDL cholesterol level       Relevant Orders   Lipid panel   Need for Tdap vaccination       Relevant Orders   Tdap vaccine greater than or equal to 7yo IM (Completed)      Return in about 1 year (around 06/21/2023) for CPE (fasting).     Wilfred Lacy, NP

## 2022-06-20 NOTE — Patient Instructions (Signed)
Go to lab  Preventive Care 21-23 Years Old, Male Preventive care refers to lifestyle choices and visits with your health care provider that can promote health and wellness. Preventive care visits are also called wellness exams. What can I expect for my preventive care visit? Counseling During your preventive care visit, your health care provider may ask about your: Medical history, including: Past medical problems. Family medical history. Current health, including: Emotional well-being. Home life and relationship well-being. Sexual activity. Lifestyle, including: Alcohol, nicotine or tobacco, and drug use. Access to firearms. Diet, exercise, and sleep habits. Safety issues such as seatbelt and bike helmet use. Sunscreen use. Work and work environment. Physical exam Your health care provider may check your: Height and weight. These may be used to calculate your BMI (body mass index). BMI is a measurement that tells if you are at a healthy weight. Waist circumference. This measures the distance around your waistline. This measurement also tells if you are at a healthy weight and may help predict your risk of certain diseases, such as type 2 diabetes and high blood pressure. Heart rate and blood pressure. Body temperature. Skin for abnormal spots. What immunizations do I need?  Vaccines are usually given at various ages, according to a schedule. Your health care provider will recommend vaccines for you based on your age, medical history, and lifestyle or other factors, such as travel or where you work. What tests do I need? Screening Your health care provider may recommend screening tests for certain conditions. This may include: Lipid and cholesterol levels. Diabetes screening. This is done by checking your blood sugar (glucose) after you have not eaten for a while (fasting). Hepatitis B test. Hepatitis C test. HIV (human immunodeficiency virus) test. STI (sexually transmitted  infection) testing, if you are at risk. Talk with your health care provider about your test results, treatment options, and if necessary, the need for more tests. Follow these instructions at home: Eating and drinking  Eat a healthy diet that includes fresh fruits and vegetables, whole grains, lean protein, and low-fat dairy products. Drink enough fluid to keep your urine pale yellow. Take vitamin and mineral supplements as recommended by your health care provider. Do not drink alcohol if your health care provider tells you not to drink. If you drink alcohol: Limit how much you have to 0-2 drinks a day. Know how much alcohol is in your drink. In the [place] one drink equals one 12 oz bottle of beer (355 mL), one 5 oz glass of wine (148 mL), or one 1 oz glass of hard liquor (44 mL). Lifestyle Brush your teeth every morning and night with fluoride toothpaste. Floss one time each day. Exercise for at least 30 minutes 5 or more days each week. Do not use any products that contain nicotine or tobacco. These products include cigarettes, chewing tobacco, and vaping devices, such as e-cigarettes. If you need help quitting, ask your health care provider. Do not use drugs. If you are sexually active, practice safe sex. Use a condom or other form of protection to prevent STIs. Find healthy ways to manage stress, such as: Meditation, yoga, or listening to music. Journaling. Talking to a trusted person. Spending time with friends and family. Minimize exposure to UV radiation to reduce your risk of skin cancer. Safety Always wear your seat belt while driving or riding in a vehicle. Do not drive: If you have been drinking alcohol. Do not ride with someone who has been drinking. If you have been   using any mind-altering substances or drugs. While texting. When you are tired or distracted. Wear a helmet and other protective equipment during sports activities. If you have firearms in your house, make  sure you follow all gun safety procedures. Seek help if you have been physically or sexually abused. What's next? Go to your health care provider once a year for an annual wellness visit. Ask your health care provider how often you should have your eyes and teeth checked. Stay up to date on all vaccines. This information is not intended to replace advice given to you by your health care provider. Make sure you discuss any questions you have with your health care provider. Document Revised: 04/14/2021 Document Reviewed: 04/14/2021 Elsevier Patient Education  2023 Elsevier Inc.  

## 2022-06-23 NOTE — Addendum Note (Signed)
Addended by: Alysia Penna L on: 06/23/2022 01:07 PM   Modules accepted: Orders

## 2022-06-24 ENCOUNTER — Telehealth: Payer: Self-pay | Admitting: Nurse Practitioner

## 2022-06-24 NOTE — Telephone Encounter (Signed)
Pt is needing a call back concerning his most recent lab results. Please advise pt [phone]

## 2022-06-27 NOTE — Telephone Encounter (Signed)
Called & spoke w/ pt about labs & scheduled fasting lab appt.

## 2022-09-26 ENCOUNTER — Other Ambulatory Visit (INDEPENDENT_AMBULATORY_CARE_PROVIDER_SITE_OTHER): Payer: Commercial Managed Care - PPO

## 2022-09-26 DIAGNOSIS — E7800 Pure hypercholesterolemia, unspecified: Secondary | ICD-10-CM | POA: Diagnosis not present

## 2022-09-26 DIAGNOSIS — D649 Anemia, unspecified: Secondary | ICD-10-CM | POA: Diagnosis not present

## 2022-09-26 LAB — CBC WITH DIFFERENTIAL/PLATELET
Basophils Absolute: 0 10*3/uL (ref 0.0–0.1)
Basophils Relative: 1 % (ref 0.0–3.0)
Eosinophils Absolute: 0.1 10*3/uL (ref 0.0–0.7)
Eosinophils Relative: 1.4 % (ref 0.0–5.0)
HCT: 42.1 % (ref 39.0–52.0)
Hemoglobin: 14 g/dL (ref 13.0–17.0)
Lymphocytes Relative: 66.5 % — ABNORMAL HIGH (ref 12.0–46.0)
Lymphs Abs: 3.2 10*3/uL (ref 0.7–4.0)
MCHC: 33.2 g/dL (ref 30.0–36.0)
MCV: 97 fl (ref 78.0–100.0)
Monocytes Absolute: 0.4 10*3/uL (ref 0.1–1.0)
Monocytes Relative: 8.7 % (ref 3.0–12.0)
Neutro Abs: 1.1 10*3/uL — ABNORMAL LOW (ref 1.4–7.7)
Neutrophils Relative %: 22.4 % — ABNORMAL LOW (ref 43.0–77.0)
Platelets: 270 10*3/uL (ref 150.0–400.0)
RBC: 4.34 Mil/uL (ref 4.22–5.81)
RDW: 12 % (ref 11.5–15.5)
WBC: 4.8 10*3/uL (ref 4.0–10.5)

## 2022-09-26 LAB — LIPID PANEL
Cholesterol: 207 mg/dL — ABNORMAL HIGH (ref 0–200)
HDL: 60.2 mg/dL (ref >39.00)
LDL Cholesterol: 137 mg/dL — ABNORMAL HIGH (ref 0–99)
NonHDL: 147.15
Total CHOL/HDL Ratio: 3
Triglycerides: 51 mg/dL (ref 0.0–149.0)
VLDL: 10.2 mg/dL (ref 0.0–40.0)

## 2022-09-26 LAB — IBC + FERRITIN
Ferritin: 33.7 ng/mL (ref 22.0–322.0)
Iron: 181 ug/dL — ABNORMAL HIGH (ref 42–165)
Saturation Ratios: 51.7 % — ABNORMAL HIGH (ref 20.0–50.0)
TIBC: 350 ug/dL (ref 250.0–450.0)
Transferrin: 250 mg/dL (ref 212.0–360.0)

## 2022-09-26 LAB — TSH: TSH: 1.07 u[IU]/mL (ref 0.35–5.50)

## 2022-09-26 LAB — VITAMIN B12: Vitamin B-12: 643 pg/mL (ref 211–911)

## 2023-05-24 ENCOUNTER — Ambulatory Visit (INDEPENDENT_AMBULATORY_CARE_PROVIDER_SITE_OTHER): Payer: Commercial Managed Care - PPO | Admitting: Nurse Practitioner

## 2023-05-24 ENCOUNTER — Encounter: Payer: Self-pay | Admitting: Nurse Practitioner

## 2023-05-24 VITALS — BP 114/68 | HR 50 | Temp 98.4°F | Ht 75.0 in | Wt 182.0 lb

## 2023-05-24 DIAGNOSIS — M25562 Pain in left knee: Secondary | ICD-10-CM

## 2023-05-24 DIAGNOSIS — E7800 Pure hypercholesterolemia, unspecified: Secondary | ICD-10-CM | POA: Diagnosis not present

## 2023-05-24 DIAGNOSIS — D709 Neutropenia, unspecified: Secondary | ICD-10-CM

## 2023-05-24 DIAGNOSIS — M25561 Pain in right knee: Secondary | ICD-10-CM | POA: Diagnosis not present

## 2023-05-24 DIAGNOSIS — Z0001 Encounter for general adult medical examination with abnormal findings: Secondary | ICD-10-CM | POA: Diagnosis not present

## 2023-05-24 DIAGNOSIS — G8929 Other chronic pain: Secondary | ICD-10-CM

## 2023-05-24 LAB — LIPID PANEL
Cholesterol: 184 mg/dL (ref 0–200)
HDL: 63.4 mg/dL (ref >39.00)
LDL Cholesterol: 111 mg/dL — ABNORMAL HIGH (ref 0–99)
NonHDL: 120.11
Total CHOL/HDL Ratio: 3
Triglycerides: 44 mg/dL (ref 0.0–149.0)
VLDL: 8.8 mg/dL (ref 0.0–40.0)

## 2023-05-24 LAB — COMPREHENSIVE METABOLIC PANEL
ALT: 41 U/L (ref 0–53)
AST: 57 U/L — ABNORMAL HIGH (ref 0–37)
Albumin: 4.6 g/dL (ref 3.5–5.2)
Alkaline Phosphatase: 54 U/L (ref 39–117)
BUN: 15 mg/dL (ref 6–23)
CO2: 28 mEq/L (ref 19–32)
Calcium: 9.8 mg/dL (ref 8.4–10.5)
Chloride: 102 mEq/L (ref 96–112)
Creatinine, Ser: 1.18 mg/dL (ref 0.40–1.50)
GFR: 86.88 mL/min (ref >60.00)
Glucose, Bld: 80 mg/dL (ref 70–99)
Potassium: 4.3 mEq/L (ref 3.5–5.1)
Sodium: 138 mEq/L (ref 135–145)
Total Bilirubin: 1.5 mg/dL — ABNORMAL HIGH (ref 0.2–1.2)
Total Protein: 7.2 g/dL (ref 6.0–8.3)

## 2023-05-24 LAB — CBC WITH DIFFERENTIAL/PLATELET
Basophils Absolute: 0 10*3/uL (ref 0.0–0.1)
Basophils Relative: 0.7 % (ref 0.0–3.0)
Eosinophils Absolute: 0 10*3/uL (ref 0.0–0.7)
Eosinophils Relative: 0.5 % (ref 0.0–5.0)
HCT: 40.3 % (ref 39.0–52.0)
Hemoglobin: 13.2 g/dL (ref 13.0–17.0)
Lymphocytes Relative: 30.8 % (ref 12.0–46.0)
Lymphs Abs: 1.5 10*3/uL (ref 0.7–4.0)
MCHC: 32.7 g/dL (ref 30.0–36.0)
MCV: 98.1 fl (ref 78.0–100.0)
Monocytes Absolute: 0.4 10*3/uL (ref 0.1–1.0)
Monocytes Relative: 7.1 % (ref 3.0–12.0)
Neutro Abs: 3 10*3/uL (ref 1.4–7.7)
Neutrophils Relative %: 60.9 % (ref 43.0–77.0)
Platelets: 267 10*3/uL (ref 150.0–400.0)
RBC: 4.11 Mil/uL — ABNORMAL LOW (ref 4.22–5.81)
RDW: 12.3 % (ref 11.5–15.5)
WBC: 4.9 10*3/uL (ref 4.0–10.5)

## 2023-05-24 NOTE — Assessment & Plan Note (Signed)
Repeat lipid panel Advised about need for heart healthy diet, avoid tobacco use and ALCOHOL consumption

## 2023-05-24 NOTE — Patient Instructions (Addendum)
[email or web address] Or use school resources or call office for referral.   Go to lab

## 2023-05-24 NOTE — Assessment & Plan Note (Signed)
Normal iron and B12 Repeat CBC with diff

## 2023-05-24 NOTE — Progress Notes (Signed)
Complete physical exam  Patient: Craig Osborn.   DOB: Sep 11, 2001   21 y.o. Male  MRN: [id number] Visit Date: 05/24/2023  Subjective:    Chief Complaint  Patient presents with   Annual Exam    Fasting and f/up on chronic conditions   Craig Bou. is a 22 y.o. male who presents today for a complete physical exam. He reports consuming a general diet. Gym/ health club routine includes basketball and cardio. He generally feels well. He reports sleeping well. He does have additional problems to discuss today.  Vision:No Dental:No STD Screen:No  BP Readings from Last 3 Encounters:  05/24/23 114/68  06/20/22 112/80  03/08/21 100/70   Wt Readings from Last 3 Encounters:  05/24/23 182 lb (82.6 kg)  06/20/22 185 lb 9.6 oz (84.2 kg)  03/08/21 174 lb (78.9 kg)   Most recent fall risk assessment:    05/24/2023    1:02 PM  Fall Risk   Falls in the past year? 1  Number falls in past yr: 0  Injury with Fall? 0   Depression screen:Yes - No Depression Most recent depression screenings:    05/24/2023    1:36 PM 06/20/2022   10:29 AM  PHQ 2/9 Scores  PHQ - 2 Score 0 0  PHQ- 9 Score 5 2    Knee Pain  The incident occurred more than 1 week ago. The incident occurred at the gym. The injury mechanism was a twisting injury. The pain is present in the left knee and right knee. The quality of the pain is described as aching. The pain has been Constant since onset. Pertinent negatives include no inability to bear weight, loss of motion, loss of sensation, muscle weakness, numbness or tingling. He reports no foreign bodies present. The symptoms are aggravated by movement and weight bearing. He has tried rest for the symptoms. The treatment provided mild relief.  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  Leukopenia Normal iron and B12 Repeat CBC with diff  Elevated LDL cholesterol level Repeat lipid panel Advised about need for heart healthy diet, avoid tobacco  use and ALCOHOL consumption   No past medical history on file. No past surgical history on file. Social History   Socioeconomic History   Marital status: Single    Spouse name: Not on file   Number of children: Not on file   Years of education: Not on file   Highest education level: Not on file  Occupational History   Not on file  Tobacco Use   Smoking status: Never   Smokeless tobacco: Never  Substance and Sexual Activity   Alcohol use: Not Currently    Comment: Rarely   Drug use: Not Currently    Comment: tried edible marijuana twice in past   Sexual activity: Not Currently    Birth control/protection: Abstinence  Other Topics Concern   Not on file  Social History Narrative   Not on file   Social Determinants of Health   Financial Resource Strain: Not on file  Food Insecurity: Not on file  Transportation Needs: Not on file  Physical Activity: Not on file  Stress: Not on file  Social Connections: Not on file  Intimate Partner Violence: Not on file   Family Status  Relation Name Status   Mother S Alive   Father D Alive   Sister Insurance risk surveyor Alive   Brother Craig Osborn Alive   Sister Corshe' Alive   MGM S (Not Specified)   PGM V (Not  Specified)   PGF  (Not Specified)  No partnership data on file   Family History  Problem Relation Age of Onset   Hypertension Mother    Gestational diabetes Mother    Hyperlipidemia Father    Asthma Brother    Asthma Maternal Grandmother    Diabetes Maternal Grandmother    Hypertension Maternal Grandmother    Hypertension Paternal Grandmother    Lung cancer Paternal Grandfather        Smoker   No Known Allergies  Patient Care Team: Katherene Dinino, Bonna Gains, NP as PCP - General (Internal Medicine)   Medications: No outpatient medications prior to visit.   No facility-administered medications prior to visit.    Review of Systems  Neurological:  Negative for tingling and numbness.        Objective:  BP 114/68 (BP Location:  Right Arm, Patient Position: Sitting, Cuff Size: Large)   Pulse (!) 50   Temp 98.4 F (36.9 C) (Temporal)   Ht 6\' 3"  (1.905 m)   Wt 182 lb (82.6 kg)   SpO2 96%   BMI 22.75 kg/m     Physical Exam Vitals and nursing note reviewed.  Constitutional:      General: He is not in acute distress. HENT:     Right Ear: Tympanic membrane, ear canal and external ear normal.     Left Ear: Tympanic membrane, ear canal and external ear normal.     Nose: Nose normal.  Eyes:     Extraocular Movements: Extraocular movements intact.     Conjunctiva/sclera: Conjunctivae normal.     Pupils: Pupils are equal, round, and reactive to light.  Neck:     Thyroid: No thyroid mass, thyromegaly or thyroid tenderness.  Cardiovascular:     Rate and Rhythm: Normal rate and regular rhythm.     Pulses: Normal pulses.     Heart sounds: Normal heart sounds.  Pulmonary:     Effort: Pulmonary effort is normal.     Breath sounds: Normal breath sounds.  Abdominal:     General: Bowel sounds are normal.     Palpations: Abdomen is soft.  Musculoskeletal:        General: Normal range of motion.     Cervical back: Normal range of motion and neck supple.     Right lower leg: No edema.     Left lower leg: No edema.  Lymphadenopathy:     Cervical: No cervical adenopathy.  Skin:    General: Skin is warm and dry.  Neurological:     Mental Status: He is alert and oriented to person, place, and time.     Cranial Nerves: No cranial nerve deficit.  Psychiatric:        Mood and Affect: Mood normal.        Behavior: Behavior normal.        Thought Content: Thought content normal.      No results found for any visits on 05/24/23.    Assessment & Plan:    Routine Health Maintenance and Physical Exam  Immunization History  Administered Date(s) Administered   DTaP 11/29/1999, 01/31/2000, 03/29/2000, 01/17/2001, 04/15/2004   HIB (PRP-OMP) 11/29/1999, 01/31/2000, 03/29/2000, 01/17/2001   HPV 9-valent 04/24/2018,  06/07/2018, 03/08/2021   Hepatitis A 06/27/2008, 05/08/2012   Hepatitis B 1998/11/23, 10/29/1999, 07/06/2000   IPV 11/29/1999, 01/31/2000, 03/29/2000, 10/05/2000, 04/15/2004   MMR 10/05/2000, 04/15/2004   Meningococcal B, OMV 04/24/2018, 06/07/2018   Meningococcal Conjugate 06/04/2013, 03/30/2018   PFIZER(Purple Top)SARS-COV-2 Vaccination 03/12/2020, 04/10/2020,  12/17/2020   Pneumococcal Conjugate-13 11/29/1999, 01/31/2000, 03/29/2000, 01/17/2001, 03/29/2001   Tdap 04/19/2011, 06/20/2022   Varicella 01/17/2001, 06/27/2008   Health Maintenance  Topic Date Due   HIV Screening  Never done   Hepatitis C Screening  Never done   COVID-19 Vaccine (4 - 2023-24 season) 06/09/2023 (Originally 07/01/2022)   INFLUENZA VACCINE  06/01/2023   DTaP/Tdap/Td (8 - Td or Tdap) 06/20/2032   HPV VACCINES  Completed   Discussed health benefits of physical activity, and encouraged him to engage in regular exercise appropriate for his age and condition.  Problem List Items Addressed This Visit       Other   Chronic pain of both knees   Relevant Orders   Ambulatory referral to Sports Medicine   Elevated LDL cholesterol level    Repeat lipid panel Advised about need for heart healthy diet, avoid tobacco use and ALCOHOL consumption      Relevant Orders   Lipid panel   Leukopenia    Normal iron and B12 Repeat CBC with diff      Relevant Orders   CBC with Differential/Platelet   Other Visit Diagnoses     Encounter for preventative adult health care exam with abnormal findings    -  Primary   Relevant Orders   Comprehensive metabolic panel      Return in about 1 year (around 05/23/2024) for CPE (fasting).     Alysia Penna, NP

## 2023-05-26 ENCOUNTER — Encounter: Payer: Self-pay | Admitting: Family Medicine

## 2023-05-26 ENCOUNTER — Ambulatory Visit: Payer: Commercial Managed Care - PPO | Admitting: Family Medicine

## 2023-05-26 ENCOUNTER — Other Ambulatory Visit: Payer: Self-pay

## 2023-05-26 ENCOUNTER — Ambulatory Visit (INDEPENDENT_AMBULATORY_CARE_PROVIDER_SITE_OTHER): Payer: Commercial Managed Care - PPO

## 2023-05-26 VITALS — BP 102/72 | HR 70 | Ht 75.0 in | Wt 180.0 lb

## 2023-05-26 DIAGNOSIS — M545 Low back pain, unspecified: Secondary | ICD-10-CM

## 2023-05-26 DIAGNOSIS — G8929 Other chronic pain: Secondary | ICD-10-CM | POA: Diagnosis not present

## 2023-05-26 DIAGNOSIS — M25561 Pain in right knee: Secondary | ICD-10-CM

## 2023-05-26 DIAGNOSIS — M25562 Pain in left knee: Secondary | ICD-10-CM

## 2023-05-26 MED ORDER — MELOXICAM 15 MG PO TABS: 15.0000 mg | ORAL_TABLET | Freq: Every day | ORAL | 0 refills | Status: AC

## 2023-05-26 NOTE — Progress Notes (Signed)
[facility] [address] [place] [address] Phone: [phone] Subjective:   Craig Osborn, am serving as a scribe for Dr. Antoine Primas.  I'm seeing this patient by the request  of:  Nche, Bonna Gains, NP  CC: Knee pain left greater than right  NGE:[id number]  Craig Osborn. is a 22 y.o. male coming in with complaint of L knee pain. Patient states that in March he injured L knee. 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 Knee gave way. Lateral lunges increase his pain. Pain behind patella. In past month he has been strengthening knee and his pain has improved. Able to play but feels he is consciously guarding the knee. Has not tried bracing. In 2021 he thinks he had MCL sprain on L knee.   Seen by [facility] but was not given any diagnosis as to what was wrong with his knee.      No past medical history on file. No past surgical history on file. Social History   Socioeconomic History   Marital status: Single    Spouse name: Not on file   Number of children: Not on file   Years of education: Not on file   Highest education level: Not on file  Occupational History   Not on file  Tobacco Use   Smoking status: Never   Smokeless tobacco: Never  Substance and Sexual Activity   Alcohol use: Not Currently    Comment: Rarely   Drug use: Not Currently    Comment: tried edible marijuana twice in past   Sexual activity: Not Currently    Birth control/protection: Abstinence  Other Topics Concern   Not on file  Social History Narrative   Not on file   Social Determinants of Health   Financial Resource Strain: Not on file  Food Insecurity: Not on file  Transportation Needs: Not on file  Physical Activity: Not on file  Stress: Not on file  Social Connections: Not on file   No Known Allergies Family History  Problem Relation Age of Onset   Hypertension Mother    Gestational diabetes Mother     Hyperlipidemia Father    Asthma Brother    Asthma Maternal Grandmother    Diabetes Maternal Grandmother    Hypertension Maternal Grandmother    Hypertension Paternal Grandmother    Lung cancer Paternal Grandfather        Smoker       Current Outpatient Medications (Analgesics):    meloxicam (MOBIC) 15 MG tablet, Take 1 tablet (15 mg total) by mouth daily.     Reviewed prior external information including notes and imaging from  primary care provider As well as notes that were available from care everywhere and other healthcare systems.  Past medical history, social, surgical and family history all reviewed in electronic medical record.  No pertanent information unless stated regarding to the chief complaint.   Review of Systems:  No headache, visual changes, nausea, vomiting, diarrhea, constipation, dizziness, abdominal pain, skin rash, fevers, chills, night sweats, weight loss, swollen lymph nodes, body aches, joint swelling, chest pain, shortness of breath, mood changes. POSITIVE muscle aches  Objective  Blood pressure 102/72, pulse 70, height 6\' 3"  (1.905 m), weight 180 lb (81.6 kg), SpO2 97%.   General: No apparent distress alert and oriented x3 mood and affect normal, dressed appropriately.  HEENT: Pupils equal, extraocular movements intact  Respiratory: Patient's speak in full sentences and does not  appear short of breath  Cardiovascular: No lower extremity edema, non tender, no erythema  Left knee does not have any significant instability noted.  Patient does have mild crepitus noted.  Patient does have a positive Trendelenburg of the back.  Weakness with hip abduction left greater than right.  Limited muscular skeletal ultrasound was performed and interpreted by Antoine Primas, M   Limited ultrasound of patient's knee shows that there is a calcific loose body noted in the patellofemoral area.  Mild hypoechoic changes consistent with a mild effusion.  Medial and lateral  meniscus appear to be unremarkable.  MCL appears to be intact Impression: Patellofemoral effusion, mild with potential loose body    Impression and Recommendations:

## 2023-05-26 NOTE — Patient Instructions (Addendum)
Xray lumbar and knee Hip abduction strength Meloxicam 15mg  for 10 days then as needed See me in 7-8 weeks

## 2023-05-26 NOTE — Assessment & Plan Note (Addendum)
Seems to be left greater than right.  There is a cortical irregularity noted within the patellofemoral joint on the left side.  My seems to be a potential loose body.  Will get x-rays to further evaluate.  Discussed with patient that I do not feel any significant instability of the knee.  Discussed other activities.  We discussed that I do think that there is significant weakness noted of the hip abductors that need to continue to work on.  Seems to have a positive Trendelenburg.  Patient will follow-up again in 6 to 8 weeks.  If worsening symptoms or no improvement will need to consider the possibility of advanced imaging.  Follow-up again in 8 to 12 weeks meloxicam prescribed as well

## 2023-08-10 ENCOUNTER — Other Ambulatory Visit: Payer: Self-pay

## 2023-08-10 ENCOUNTER — Emergency Department (HOSPITAL_BASED_OUTPATIENT_CLINIC_OR_DEPARTMENT_OTHER): Payer: Commercial Managed Care - PPO | Admitting: Radiology

## 2023-08-10 ENCOUNTER — Encounter (HOSPITAL_BASED_OUTPATIENT_CLINIC_OR_DEPARTMENT_OTHER): Payer: Self-pay

## 2023-08-10 ENCOUNTER — Emergency Department (HOSPITAL_BASED_OUTPATIENT_CLINIC_OR_DEPARTMENT_OTHER)
Admission: EM | Admit: 2023-08-10 | Discharge: 2023-08-10 | Disposition: A | Discharge status: Home / Self Care | Payer: Commercial Managed Care - PPO | Attending: Emergency Medicine | Admitting: Emergency Medicine

## 2023-08-10 DIAGNOSIS — Y9367 Activity, basketball: Secondary | ICD-10-CM | POA: Insufficient documentation

## 2023-08-10 DIAGNOSIS — S8392XA Sprain of unspecified site of left knee, initial encounter: Secondary | ICD-10-CM | POA: Diagnosis not present

## 2023-08-10 DIAGNOSIS — X509XXA Other and unspecified overexertion or strenuous movements or postures, initial encounter: Secondary | ICD-10-CM | POA: Diagnosis not present

## 2023-08-10 DIAGNOSIS — M25562 Pain in left knee: Secondary | ICD-10-CM | POA: Diagnosis present

## 2023-08-10 NOTE — ED Triage Notes (Signed)
Pt states that he was playing basketball today and hurt his L knee

## 2023-08-10 NOTE — Discharge Instructions (Addendum)
Apply ice for thirty minutes at a time, four times a day.  Wear the brace as needed.  Take ibuprofen and/or acetaminophen as needed for pain.

## 2023-08-10 NOTE — ED Notes (Signed)
Pt injured his left knee while playing basketball last night.  Has a knee brace on and is using one crutch

## 2023-08-10 NOTE — ED Provider Notes (Signed)
[place] [facility] AT [facility] Provider Note   CSN: [id number] Arrival date & time: 08/10/23  0045     History  Chief Complaint  Patient presents with   Knee Pain    Craig Osborn. is a 22 y.o. male.  The history is provided by the patient.  Knee Pain He was playing basketball when he planted his left foot and felt something pull in his left knee.  He has been unable to bear weight since then.  He did notice swelling to the knee.  He had a similar injury about 4 months ago, but did not seek medical attention.   Home Medications Prior to Admission medications   Medication Sig Start Date End Date Taking? Authorizing Provider  meloxicam (MOBIC) 15 MG tablet Take 1 tablet (15 mg total) by mouth daily. 05/26/23   Judi Saa, DO      Allergies    Patient has no known allergies.    Review of Systems   Review of Systems  All other systems reviewed and are negative.   Physical Exam Updated Vital Signs BP (!) 142/55 (BP Location: Right Arm)   Pulse 63   Temp 99 F (37.2 C) (Oral)   Resp 18   SpO2 99%  Physical Exam Vitals and nursing note reviewed.   22 year old male, resting comfortably and in no acute distress. Vital signs are significant for borderline elevated blood pressure. Oxygen saturation is 99%, which is normal.  Extremities: There is a small to moderate suprapatellar effusion present on the left knee.  There is tenderness to palpation over the medial joint line of the left knee.  There is no instability on valgus or varus stress.  Lachman test is negative.  McMurray's test is suboptimal because of pain but grossly negative.  ED Results / Procedures / Treatments    Radiology DG Knee Complete 4 Views Left  Result Date: 08/10/2023 CLINICAL DATA:  Injury to left knee while playing basketball. EXAM: LEFT KNEE - COMPLETE 4+ VIEW COMPARISON:  05/26/2023. FINDINGS: No acute fracture or dislocation. Mild degenerative changes are  present at the patellofemoral compartment. There is a small suprapatellar joint effusion. Soft tissue swelling is present anterior to the knee. IMPRESSION: 1. No acute fracture or dislocation. 2. Mild degenerative changes in the patellofemoral compartment. 3. Small suprapatellar joint effusion. 4. Soft tissue swelling anterior to the knee. Electronically Signed   By: Thornell Sartorius M.D.   On: 08/10/2023 02:20    Procedures Procedures    Medications Ordered in ED Medications - No data to display  ED Course/ Medical Decision Making/ A&P                                 Medical Decision Making Amount and/or Complexity of Data Reviewed Radiology: ordered.   Sprain of the left knee.  X-rays show presence of a suprapatellar effusion but no acute bony injury.  I have independently viewed the images, and agree with radiologist's interpretation.  Patient already has crutches and a knee brace and he is advised to use those.  I have recommended ice and told him to use over-the-counter NSAIDs and acetaminophen as needed for pain.  He states that he already has an MRI scheduled for 10/14.  I am referring him to orthopedics for follow-up.  Final Clinical Impression(s) / ED Diagnoses Final diagnoses:  Sprain of left knee, initial encounter  Rx / DC Orders ED Discharge Orders     None         Dione Booze, MD 08/10/23 [phone]

## 2023-11-27 ENCOUNTER — Encounter: Payer: Self-pay | Admitting: Nurse Practitioner
